# Patient Record
Sex: Female | Born: 1940 | Race: Black or African American | Hispanic: No | Smoking: Never smoker
Health system: Southern US, Community
[De-identification: ages and names within clinical notes are randomized; demographics above are authoritative.]

---

## 2008-06-30 ENCOUNTER — Encounter: Admission: RE | Admit: 2008-06-30 | Discharge: 2008-06-30 | Payer: Self-pay | Admitting: Sports Medicine

## 2008-08-25 ENCOUNTER — Encounter: Admission: RE | Admit: 2008-08-25 | Discharge: 2008-08-25 | Payer: Self-pay | Admitting: Sports Medicine

## 2008-09-02 ENCOUNTER — Ambulatory Visit: Payer: Self-pay | Admitting: Physical Medicine & Rehabilitation

## 2010-09-07 IMAGING — CR DG KNEE COMPLETE 4+V*L*
3 series · 3 of 3 positions shown · non-contrast
Comparison: None.

CLINICAL DATA: Bilateral, right greater than left, knee pain.
Bilateral arthroscopic surgery.

BILATERAL KNEES STANDING - 1 VIEW,LEFT KNEE - COMPLETE 4+
VIEW,RIGHT KNEE - COMPLETE 4+ VIEW

[view not recorded (1 of 3)]
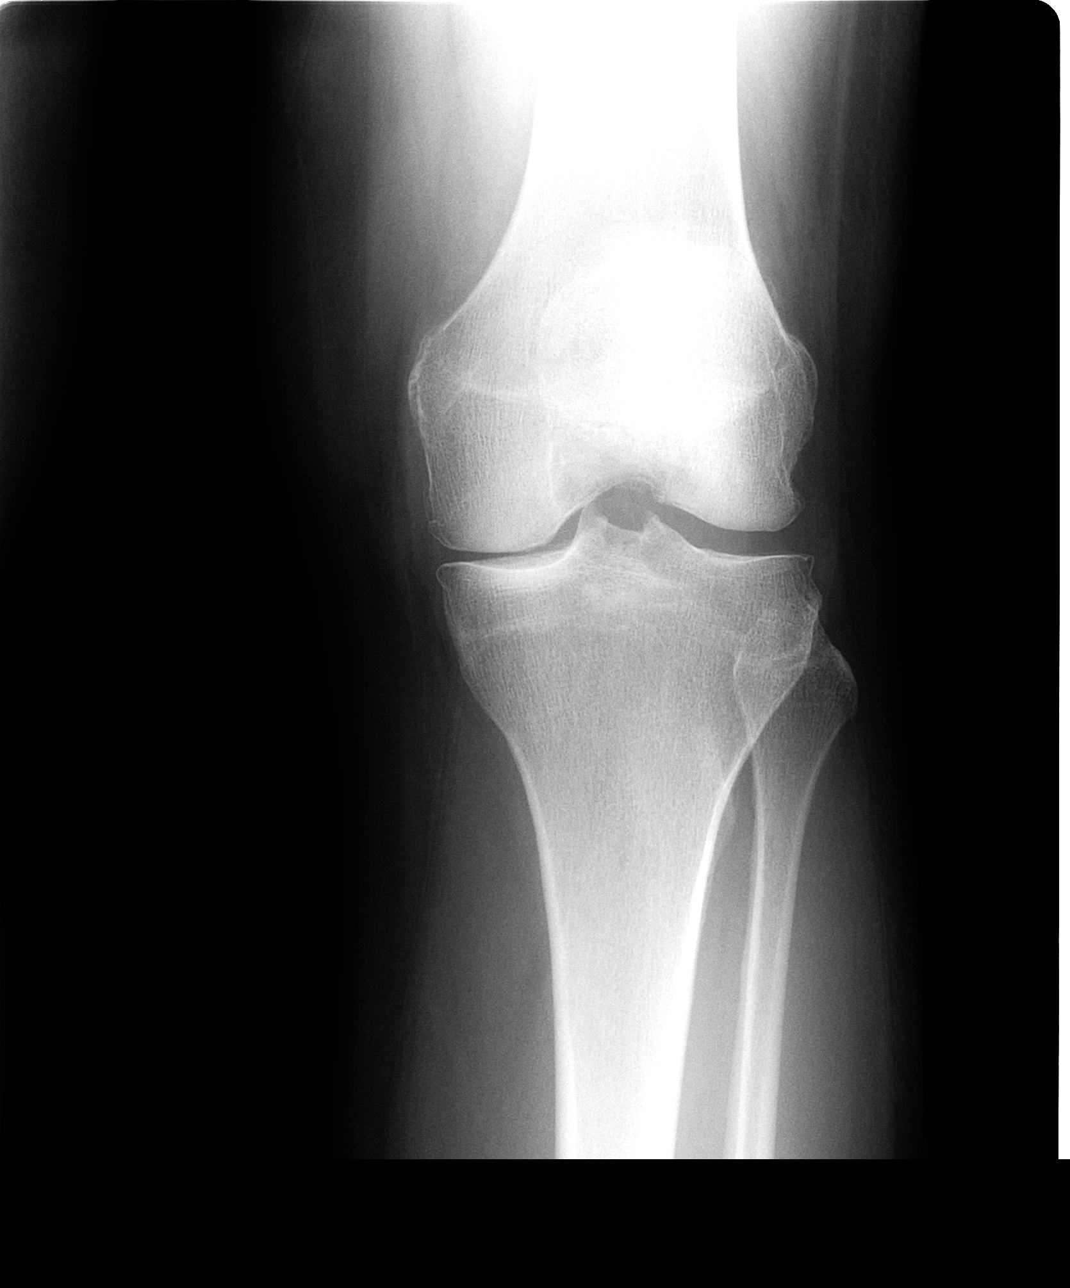

[view not recorded (2 of 3)]
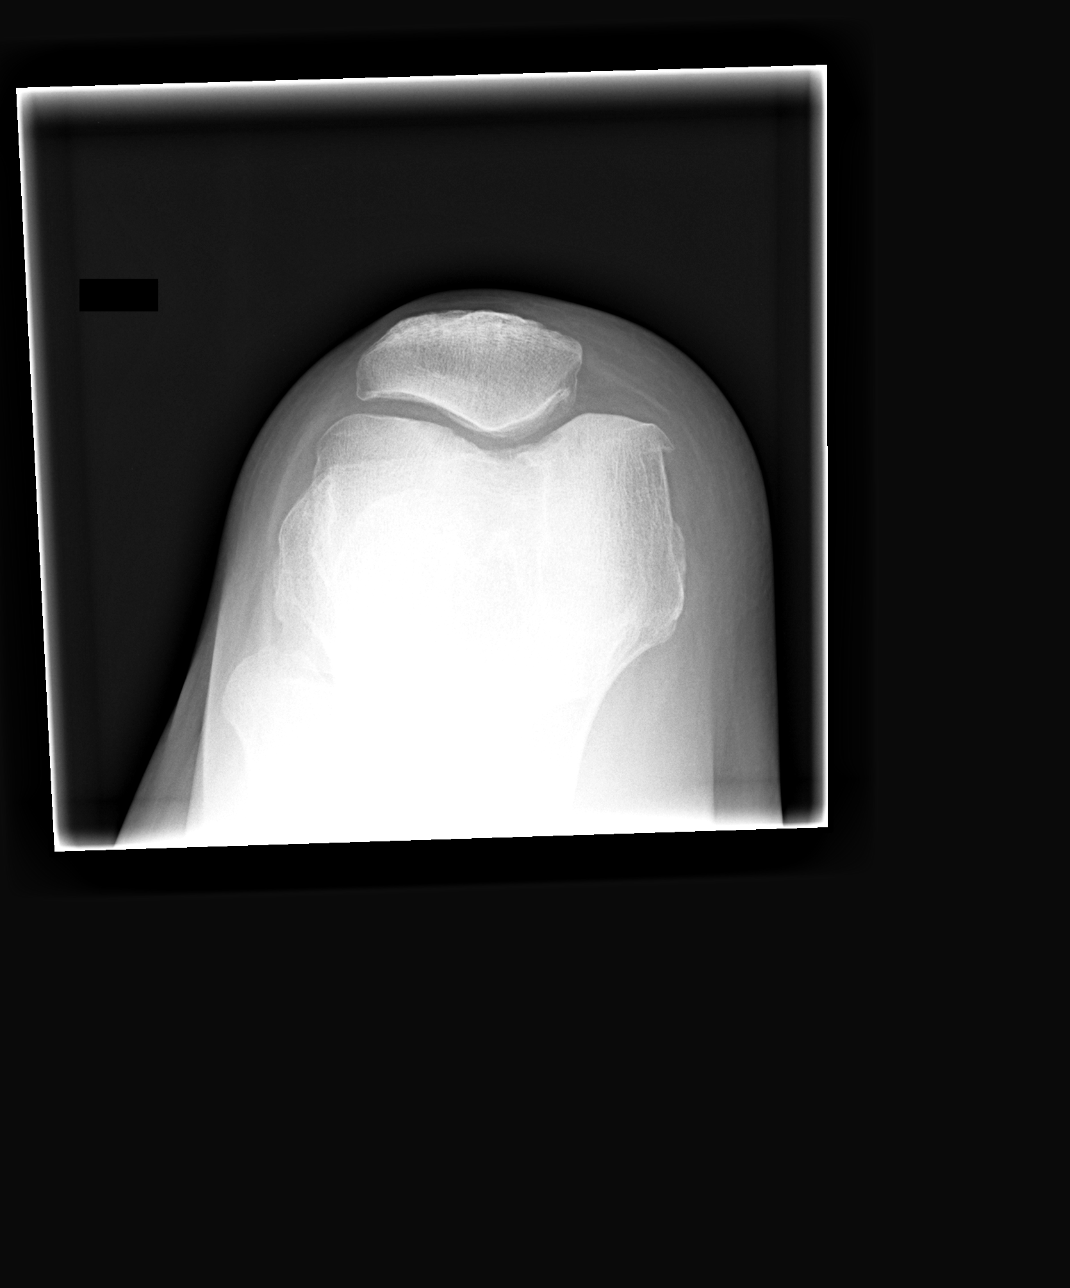

[view not recorded (3 of 3)]
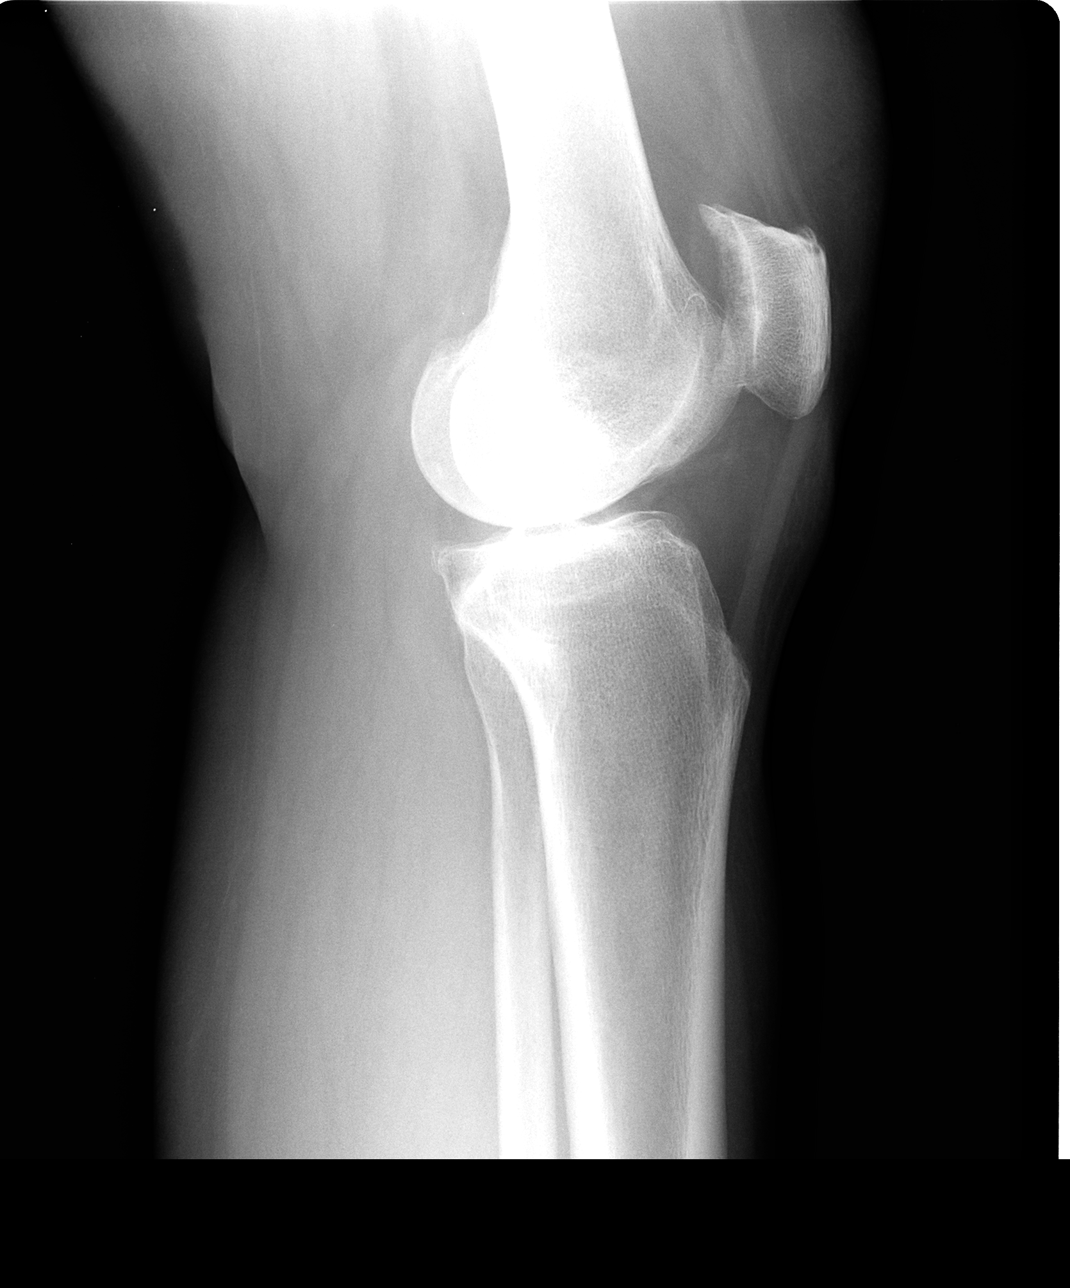

[3 of 3 positions shown; findings below may reference images not displayed]

FINDINGS: Mild tricompartment degenerative changes with joint space
narrowing most notable involving the medial tibial femoral joint
space and the patellofemoral joint space bilaterally.  Small right
suprapatellar joint effusion.
IMPRESSION: Mild tricompartment degenerative changes as noted above.

## 2010-09-07 IMAGING — CR DG KNEE STANDING AP BILAT
1 series · 1 of 1 positions shown · non-contrast
Comparison: None.

CLINICAL DATA: Bilateral, right greater than left, knee pain.
Bilateral arthroscopic surgery.

BILATERAL KNEES STANDING - 1 VIEW,LEFT KNEE - COMPLETE 4+
VIEW,RIGHT KNEE - COMPLETE 4+ VIEW

[view not recorded]
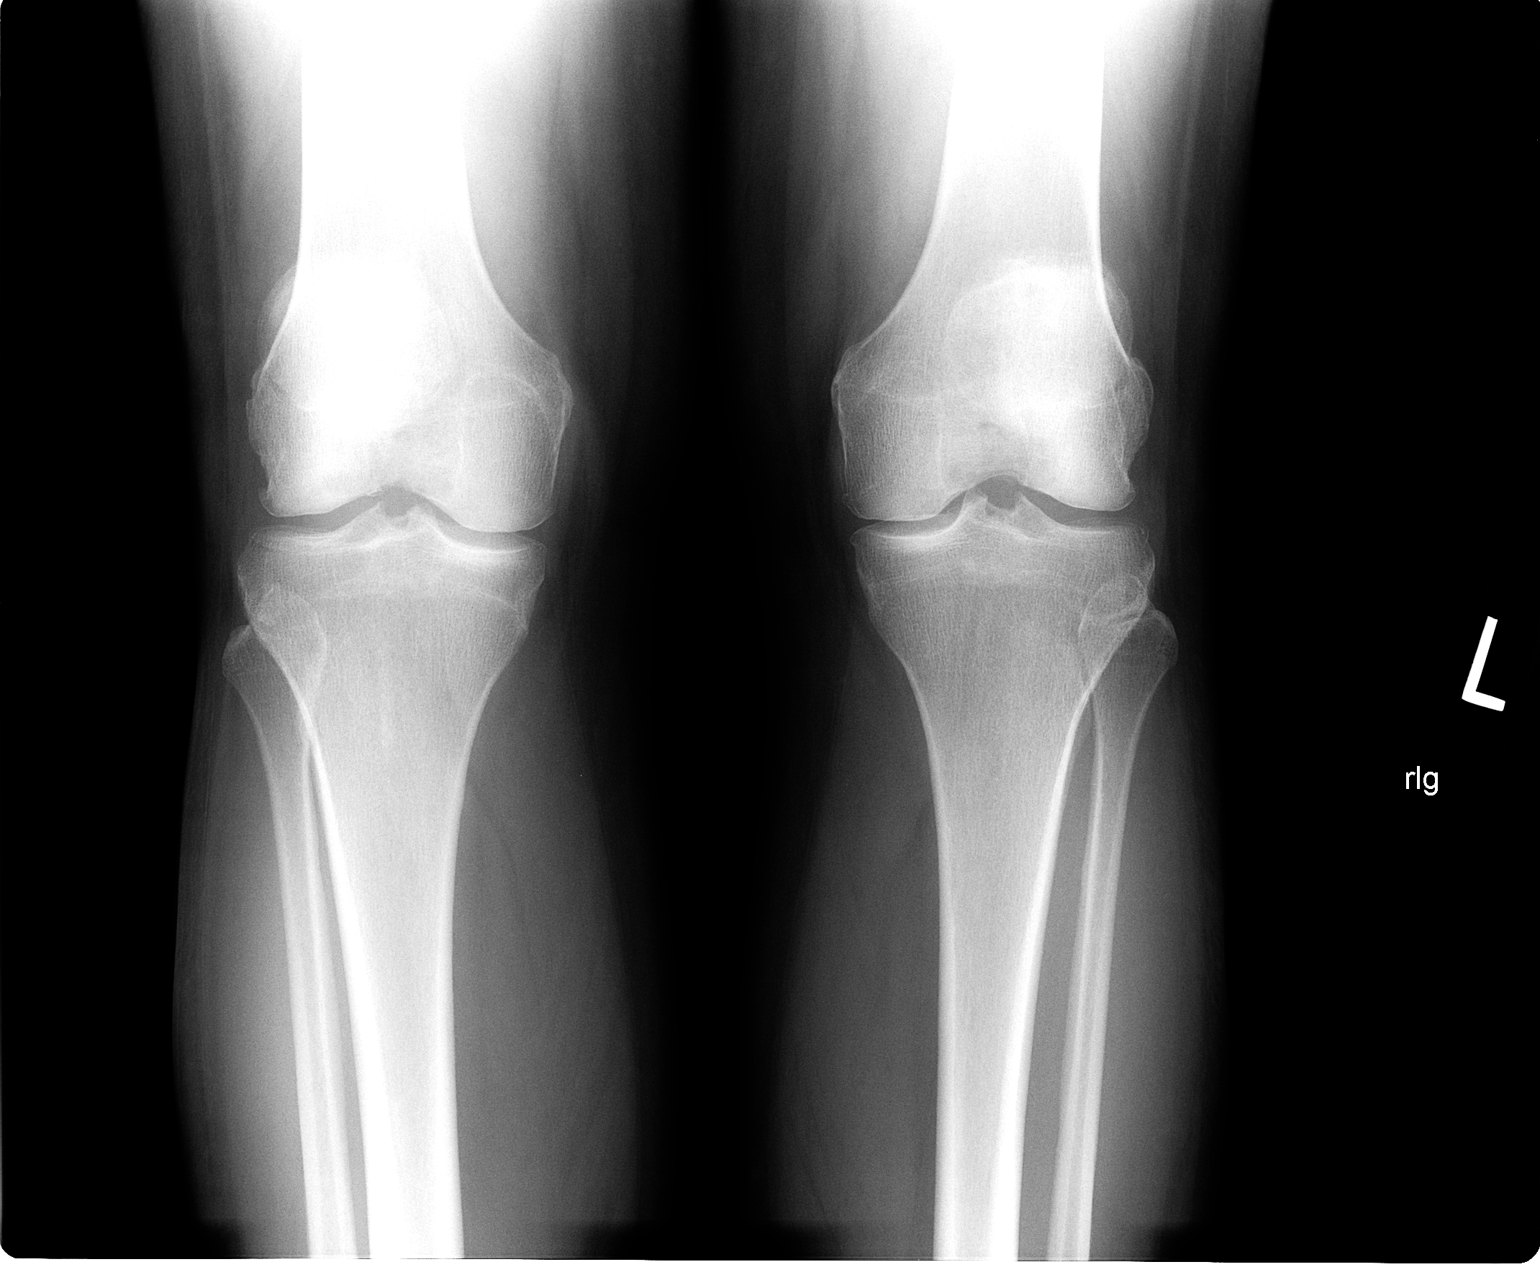

[1 of 1 positions shown; findings below may reference images not displayed]

FINDINGS: Mild tricompartment degenerative changes with joint space
narrowing most notable involving the medial tibial femoral joint
space and the patellofemoral joint space bilaterally.  Small right
suprapatellar joint effusion.
IMPRESSION: Mild tricompartment degenerative changes as noted above.

## 2013-11-01 ENCOUNTER — Ambulatory Visit (INDEPENDENT_AMBULATORY_CARE_PROVIDER_SITE_OTHER): Payer: 59 | Admitting: Physical Therapy

## 2013-11-01 DIAGNOSIS — M47817 Spondylosis without myelopathy or radiculopathy, lumbosacral region: Secondary | ICD-10-CM

## 2013-11-01 DIAGNOSIS — M76899 Other specified enthesopathies of unspecified lower limb, excluding foot: Secondary | ICD-10-CM

## 2013-11-01 DIAGNOSIS — M2550 Pain in unspecified joint: Secondary | ICD-10-CM

## 2013-11-01 DIAGNOSIS — M6281 Muscle weakness (generalized): Secondary | ICD-10-CM

## 2013-11-01 DIAGNOSIS — M67919 Unspecified disorder of synovium and tendon, unspecified shoulder: Secondary | ICD-10-CM

## 2013-11-01 DIAGNOSIS — M719 Bursopathy, unspecified: Secondary | ICD-10-CM

## 2013-11-01 DIAGNOSIS — M5030 Other cervical disc degeneration, unspecified cervical region: Secondary | ICD-10-CM

## 2013-11-09 ENCOUNTER — Encounter (INDEPENDENT_AMBULATORY_CARE_PROVIDER_SITE_OTHER): Payer: 59 | Admitting: Physical Therapy

## 2013-11-09 DIAGNOSIS — M47817 Spondylosis without myelopathy or radiculopathy, lumbosacral region: Secondary | ICD-10-CM

## 2013-11-09 DIAGNOSIS — M5030 Other cervical disc degeneration, unspecified cervical region: Secondary | ICD-10-CM

## 2013-11-09 DIAGNOSIS — M6281 Muscle weakness (generalized): Secondary | ICD-10-CM

## 2013-11-09 DIAGNOSIS — M719 Bursopathy, unspecified: Secondary | ICD-10-CM

## 2013-11-09 DIAGNOSIS — M76899 Other specified enthesopathies of unspecified lower limb, excluding foot: Secondary | ICD-10-CM

## 2013-11-09 DIAGNOSIS — M2550 Pain in unspecified joint: Secondary | ICD-10-CM

## 2013-11-09 DIAGNOSIS — M67919 Unspecified disorder of synovium and tendon, unspecified shoulder: Secondary | ICD-10-CM

## 2013-11-16 ENCOUNTER — Encounter (INDEPENDENT_AMBULATORY_CARE_PROVIDER_SITE_OTHER): Payer: 59 | Admitting: Physical Therapy

## 2013-11-16 DIAGNOSIS — M2550 Pain in unspecified joint: Secondary | ICD-10-CM

## 2013-11-16 DIAGNOSIS — M6281 Muscle weakness (generalized): Secondary | ICD-10-CM

## 2013-11-16 DIAGNOSIS — M47817 Spondylosis without myelopathy or radiculopathy, lumbosacral region: Secondary | ICD-10-CM

## 2013-11-16 DIAGNOSIS — M5030 Other cervical disc degeneration, unspecified cervical region: Secondary | ICD-10-CM

## 2013-11-16 DIAGNOSIS — M76899 Other specified enthesopathies of unspecified lower limb, excluding foot: Secondary | ICD-10-CM

## 2013-11-16 DIAGNOSIS — M7500 Adhesive capsulitis of unspecified shoulder: Secondary | ICD-10-CM

## 2014-01-24 ENCOUNTER — Ambulatory Visit: Payer: 59 | Admitting: Sports Medicine

## 2014-01-26 ENCOUNTER — Encounter: Payer: Self-pay | Admitting: Sports Medicine

## 2014-01-26 ENCOUNTER — Ambulatory Visit (INDEPENDENT_AMBULATORY_CARE_PROVIDER_SITE_OTHER): Payer: 59 | Admitting: Sports Medicine

## 2014-01-26 VITALS — BP 161/77 | HR 65 | Ht 65.0 in | Wt 186.0 lb

## 2014-01-26 DIAGNOSIS — M1710 Unilateral primary osteoarthritis, unspecified knee: Secondary | ICD-10-CM

## 2014-01-26 DIAGNOSIS — M170 Bilateral primary osteoarthritis of knee: Secondary | ICD-10-CM

## 2014-01-26 DIAGNOSIS — IMO0002 Reserved for concepts with insufficient information to code with codable children: Secondary | ICD-10-CM

## 2014-01-26 MED ORDER — MELOXICAM 15 MG PO TABS: ORAL_TABLET | ORAL | Status: DC

## 2014-01-26 NOTE — Progress Notes (Signed)
  Subjective:    CC: Bilateral knee pain  HPI:  This is a pleasant 73 year-old female, she has a history of multiple knee arthroscopies and knee osteoarthritis, she comes in with recurrence of pain she localizes up with joint lines. Moderate, persistent without mechanical symptoms. She has had physical supplementation in the past and desires to proceed only with steroid injection today. She is not using any oral medications.  Past medical history, Surgical history, Family history not pertinant except as noted below, Social history, Allergies, and medications have been entered into the medical record, reviewed, and no changes needed.   Review of Systems: No headache, visual changes, nausea, vomiting, diarrhea, constipation, dizziness, abdominal pain, skin rash, fevers, chills, night sweats, swollen lymph nodes, weight loss, chest pain, body aches, joint swelling, muscle aches, shortness of breath, mood changes, visual or auditory hallucinations.  Objective:    General: Well Developed, well nourished, and in no acute distress.  Neuro: Alert and oriented x3, extra-ocular muscles intact, sensation grossly intact.  HEENT: Normocephalic, atraumatic, pupils equal round reactive to light, neck supple, no masses, no lymphadenopathy, thyroid nonpalpable.  Skin: Warm and dry, no rashes noted.  Cardiac: Regular rate and rhythm, no murmurs rubs or gallops.  Respiratory: Clear to auscultation bilaterally. Not using accessory muscles, speaking in full sentences.  Abdominal: Soft, nontender, nondistended, positive bowel sounds, no masses, no organomegaly.  Bilateral Knee: No effusion, tender to palpation of the joint lines. ROM full in flexion and extension and lower leg rotation. Ligaments with solid consistent endpoints including ACL, PCL, LCL, MCL. Negative Mcmurray's, Apley's, and Thessalonian tests. Non painful patellar compression. Patellar glide without crepitus. Patellar and quadriceps tendons  unremarkable. Hamstring and quadriceps strength is normal.   Procedure: Real-time Ultrasound Guided Injection of right knee Device: GE Logiq E  Verbal informed consent obtained.  Time-out conducted.  Noted no overlying erythema, induration, or other signs of local infection.  Skin prepped in a sterile fashion.  Local anesthesia: Topical Ethyl chloride.  With sterile technique and under real time ultrasound guidance:  2 cc Kenalog 40, 4 cc lidocaine injected easily into the suprapatellar recess. Completed without difficulty  Pain immediately resolved suggesting accurate placement of the medication.  Advised to call if fevers/chills, erythema, induration, drainage, or persistent bleeding.  Images permanently stored and available for review in the ultrasound unit.  Impression: Technically successful ultrasound guided injection.  Procedure: Real-time Ultrasound Guided Injection of left knee Device: GE Logiq E  Verbal informed consent obtained.  Time-out conducted.  Noted no overlying erythema, induration, or other signs of local infection.  Skin prepped in a sterile fashion.  Local anesthesia: Topical Ethyl chloride.  With sterile technique and under real time ultrasound guidance:  2 cc Kenalog 40, 4 cc lidocaine injected easily into the suprapatellar recess. Completed without difficulty  Pain immediately resolved suggesting accurate placement of the medication.  Advised to call if fevers/chills, erythema, induration, drainage, or persistent bleeding.  Images permanently stored and available for review in the ultrasound unit.  Impression: Technically successful ultrasound guided injection.  Impression and Recommendations:    The patient was counselled, risk factors were discussed, anticipatory guidance given.

## 2014-01-26 NOTE — Assessment & Plan Note (Signed)
Injection into both knees. Mobic, physical therapy. Return in a month.

## 2014-02-09 ENCOUNTER — Ambulatory Visit (INDEPENDENT_AMBULATORY_CARE_PROVIDER_SITE_OTHER): Payer: 59 | Admitting: Physical Therapy

## 2014-02-09 ENCOUNTER — Ambulatory Visit: Payer: 59 | Admitting: Sports Medicine

## 2014-02-09 DIAGNOSIS — M25669 Stiffness of unspecified knee, not elsewhere classified: Secondary | ICD-10-CM

## 2014-02-09 DIAGNOSIS — M1710 Unilateral primary osteoarthritis, unspecified knee: Secondary | ICD-10-CM

## 2014-02-09 DIAGNOSIS — IMO0002 Reserved for concepts with insufficient information to code with codable children: Secondary | ICD-10-CM

## 2014-02-09 DIAGNOSIS — M25569 Pain in unspecified knee: Secondary | ICD-10-CM

## 2014-02-09 DIAGNOSIS — R269 Unspecified abnormalities of gait and mobility: Secondary | ICD-10-CM

## 2014-02-17 ENCOUNTER — Encounter (INDEPENDENT_AMBULATORY_CARE_PROVIDER_SITE_OTHER): Payer: 59 | Admitting: Physical Therapy

## 2014-02-17 DIAGNOSIS — IMO0002 Reserved for concepts with insufficient information to code with codable children: Secondary | ICD-10-CM

## 2014-02-17 DIAGNOSIS — R269 Unspecified abnormalities of gait and mobility: Secondary | ICD-10-CM

## 2014-02-17 DIAGNOSIS — M25669 Stiffness of unspecified knee, not elsewhere classified: Secondary | ICD-10-CM

## 2014-02-17 DIAGNOSIS — M1710 Unilateral primary osteoarthritis, unspecified knee: Secondary | ICD-10-CM

## 2014-02-17 DIAGNOSIS — M25569 Pain in unspecified knee: Secondary | ICD-10-CM

## 2014-02-23 ENCOUNTER — Encounter (INDEPENDENT_AMBULATORY_CARE_PROVIDER_SITE_OTHER): Payer: 59 | Admitting: Physical Therapy

## 2014-02-23 ENCOUNTER — Ambulatory Visit (INDEPENDENT_AMBULATORY_CARE_PROVIDER_SITE_OTHER): Payer: 59 | Admitting: Sports Medicine

## 2014-02-23 ENCOUNTER — Encounter: Payer: Self-pay | Admitting: Sports Medicine

## 2014-02-23 VITALS — BP 171/77 | HR 59 | Ht 65.0 in | Wt 189.0 lb

## 2014-02-23 DIAGNOSIS — M1710 Unilateral primary osteoarthritis, unspecified knee: Secondary | ICD-10-CM

## 2014-02-23 DIAGNOSIS — M170 Bilateral primary osteoarthritis of knee: Secondary | ICD-10-CM

## 2014-02-23 DIAGNOSIS — M25569 Pain in unspecified knee: Secondary | ICD-10-CM

## 2014-02-23 DIAGNOSIS — IMO0002 Reserved for concepts with insufficient information to code with codable children: Secondary | ICD-10-CM

## 2014-02-23 DIAGNOSIS — M25669 Stiffness of unspecified knee, not elsewhere classified: Secondary | ICD-10-CM

## 2014-02-23 DIAGNOSIS — R269 Unspecified abnormalities of gait and mobility: Secondary | ICD-10-CM

## 2014-02-23 MED ORDER — DEVILS CLAW ROOT POWD: Status: DC

## 2014-02-23 MED ORDER — GLUCOSAMINE-CHONDROITIN 750-600 MG PO CHEW: 2.0000 | CHEWABLE_TABLET | Freq: Two times a day (BID) | ORAL | Status: AC

## 2014-02-23 NOTE — Assessment & Plan Note (Signed)
Doing well after injection, still has some pain. She did have some hematochezia after using meloxicam, and has been set up with gastroenterology by her primary care doctor. She does desire to proceed more with nonpharmacologic measures, I have recommended glucosamine/chondroitin and Devils Claw. Return as needed, she will be a candidate for viscus supplementation should she desire.

## 2014-02-23 NOTE — Progress Notes (Signed)
  Subjective:    CC: Followup  HPI: Bilateral knee osteoarthritis: This is a very pleasant 73 year old female, one month ago we did bilateral intra-articular injections, formal physical therapy, and meloxicam. She had a fantastic response to injections but unfortunately had some hematochezia on the meloxicam and has been referred to gastroenterology by her primary care doctor. Overall happy with the results, she does desire to proceed further with a more natural, I have recommended glucosamine/chondroitin and devils claw. Pain is mild, improving.  Past medical history, Surgical history, Family history not pertinant except as noted below, Social history, Allergies, and medications have been entered into the medical record, reviewed, and no changes needed.   Review of Systems: No fevers, chills, night sweats, weight loss, chest pain, or shortness of breath.   Objective:    General: Well Developed, well nourished, and in no acute distress.  Neuro: Alert and oriented x3, extra-ocular muscles intact, sensation grossly intact.  HEENT: Normocephalic, atraumatic, pupils equal round reactive to light, neck supple, no masses, no lymphadenopathy, thyroid nonpalpable.  Skin: Warm and dry, no rashes. Cardiac: Regular rate and rhythm, no murmurs rubs or gallops, no lower extremity edema.  Respiratory: Clear to auscultation bilaterally. Not using accessory muscles, speaking in full sentences. Bilateral Knee: Normal to inspection with no erythema or effusion or obvious bony abnormalities. Palpation normal with no warmth or joint line tenderness or patellar tenderness or condyle tenderness. ROM normal in flexion and extension and lower leg rotation. Ligaments with solid consistent endpoints including ACL, PCL, LCL, MCL. Negative Mcmurray's and provocative meniscal tests. Non painful patellar compression. Patellar and quadriceps tendons unremarkable. Hamstring and quadriceps strength is  normal.  Impression and Recommendations:

## 2014-06-20 ENCOUNTER — Other Ambulatory Visit: Payer: Self-pay | Admitting: Sports Medicine

## 2017-07-08 ENCOUNTER — Ambulatory Visit: Payer: Medicare Other | Admitting: Podiatry

## 2017-07-08 ENCOUNTER — Ambulatory Visit (INDEPENDENT_AMBULATORY_CARE_PROVIDER_SITE_OTHER): Payer: Medicare Other

## 2017-07-08 ENCOUNTER — Encounter: Payer: Self-pay | Admitting: Podiatry

## 2017-07-08 VITALS — BP 142/68 | HR 59

## 2017-07-08 DIAGNOSIS — E1142 Type 2 diabetes mellitus with diabetic polyneuropathy: Secondary | ICD-10-CM

## 2017-07-08 DIAGNOSIS — R52 Pain, unspecified: Secondary | ICD-10-CM

## 2017-07-08 DIAGNOSIS — M79605 Pain in left leg: Secondary | ICD-10-CM | POA: Diagnosis not present

## 2017-07-08 DIAGNOSIS — M79604 Pain in right leg: Secondary | ICD-10-CM | POA: Diagnosis not present

## 2017-07-08 NOTE — Patient Instructions (Signed)
Diabetic foot screen today demonstrated adequate circulation with adequate feeling Your main problem is painful diabetic peripheral neuropathy Currently you're taking gabapentin 300 mg by mouth 1 -    3 times a day for the past year At bedtime only increase the gabapentin from 1 to 2 -300 mg gabapentin Take your tramadol approximately an hour prior to going to bed You have an appointment pending with neurologist in the next month, consult a neurologist about other treatment options for painful diabetic peripheral neuropathy   Diabetes and Foot Care Diabetes may cause you to have problems because of poor blood supply (circulation) to your feet and legs. This may cause the skin on your feet to become thinner, break easier, and heal more slowly. Your skin may become dry, and the skin may peel and crack. You may also have nerve damage in your legs and feet causing decreased feeling in them. You may not notice minor injuries to your feet that could lead to infections or more serious problems. Taking care of your feet is one of the most important things you can do for yourself. Follow these instructions at home:  Wear shoes at all times, even in the house. Do not go barefoot. Bare feet are easily injured.  Check your feet daily for blisters, cuts, and redness. If you cannot see the bottom of your feet, use a mirror or ask someone for help.  Wash your feet with warm water (do not use hot water) and mild soap. Then pat your feet and the areas between your toes until they are completely dry. Do not soak your feet as this can dry your skin.  Apply a moisturizing lotion or petroleum jelly (that does not contain alcohol and is unscented) to the skin on your feet and to dry, brittle toenails. Do not apply lotion between your toes.  Trim your toenails straight across. Do not dig under them or around the cuticle. File the edges of your nails with an emery board or nail file.  Do not cut corns or calluses or  try to remove them with medicine.  Wear clean socks or stockings every day. Make sure they are not too tight. Do not wear knee-high stockings since they may decrease blood flow to your legs.  Wear shoes that fit properly and have enough cushioning. To break in new shoes, wear them for just a few hours a day. This prevents you from injuring your feet. Always look in your shoes before you put them on to be sure there are no objects inside.  Do not cross your legs. This may decrease the blood flow to your feet.  If you find a minor scrape, cut, or break in the skin on your feet, keep it and the skin around it clean and dry. These areas may be cleansed with mild soap and water. Do not cleanse the area with peroxide, alcohol, or iodine.  When you remove an adhesive bandage, be sure not to damage the skin around it.  If you have a wound, look at it several times a day to make sure it is healing.  Do not use heating pads or hot water bottles. They may burn your skin. If you have lost feeling in your feet or legs, you may not know it is happening until it is too late.  Make sure your health care provider performs a complete foot exam at least annually or more often if you have foot problems. Report any cuts, sores, or bruises to  your health care provider immediately. Contact a health care provider if:  You have an injury that is not healing.  You have cuts or breaks in the skin.  You have an ingrown nail.  You notice redness on your legs or feet.  You feel burning or tingling in your legs or feet.  You have pain or cramps in your legs and feet.  Your legs or feet are numb.  Your feet always feel cold. Get help right away if:  There is increasing redness, swelling, or pain in or around a wound.  There is a red line that goes up your leg.  Pus is coming from a wound.  You develop a fever or as directed by your health care provider.  You notice a bad smell coming from an ulcer or  wound. This information is not intended to replace advice given to you by your health care provider. Make sure you discuss any questions you have with your health care provider. Document Released: 07/26/2000 Document Revised: 01/04/2016 Document Reviewed: 01/05/2013 Elsevier Interactive Patient Education  2017 [employer].

## 2017-07-08 NOTE — Progress Notes (Signed)
Subjective:    Patient ID: Shelley Barker, female    DOB: 05/09/1941, 76 y.o.   MRN: [id number]  HPI This patient presents today complaining of a 1+ year history of extremely painful, burning, numbness, stinging in her right and left feet on and off weightbearing. As a result of these painful sensations patient has extremely difficult time falling and staying asleep. Patient said she has had Lyrica in the past was intolerant of the medication was discontinued. Patient currently taking gabapentin 300 mg 3 times a day for approximately 1 year with little to no reduction the symptoms. Patient denies any side effects from the gabapentin. She also mentions that she takes tramadol for generalized arthritic pain. Also, patient has tried a variety of creams without any improvement of symptoms Patient secondarily is concerned about some prominence the base of first metatarsal cuneiform areas as well as the posterior left heel has occasional discomfort Patient says she is a diabetic for approximately 2 years She denies any history of claudication or amputation She denies smoking history   Review of Systems  HENT: Positive for rhinorrhea and trouble swallowing.   Eyes: Positive for redness, itching and visual disturbance.  Respiratory: Positive for shortness of breath.   Cardiovascular: Positive for chest pain.  Gastrointestinal: Positive for abdominal pain.  Endocrine: Positive for heat intolerance.  Musculoskeletal: Positive for back pain and gait problem.       Muscle pain  Skin: Positive for color change.  Neurological: Positive for light-headedness and numbness.       Objective:   Physical Exam   Patient estimates height is 5 feet 5 inches and weighed approximately 190 pounds Orientated 3  Vascular: No peripheral edema noted bilaterally DP pulses 2/4 bilaterally PT pulses 2/4 bilaterally Reflex within normal limits bilaterally  Neurologic: Sensation to 10 g monofilament wire intact  8/8 bilaterally Vibratory sensation reactive bilaterally Ankle reflexes reactive bilaterally  Dermatological: No open skin lesions bilaterally Absent hair growth bilaterally  Musculoskeletal: Mild prominence dorsal bases first metatarsocuneiform area bilaterally Hammertoe second bilaterally No restriction ankle, subtalar, midtarsal joints bilaterally  X-ray examination weightbearing right foot dated 07/08/2017 Intact bony structures without fracture and/or dislocation Inferior calcaneal spur Hammertoes 2, 3 HAV General bone density adequate Mild decrease joint space first MPJ  Radiographic impression: No acute bony abnormality noted weightbearing x-ray of 07/08/2017  X-ray examination weightbearing left foot dated 07/08/2017 Intact bony structure without fracture and/or dislocation  bone density within normal limits Posterior heel spur with some calcification in the tendo Achilles at its distal insertion Hammertoe 2, 3  Radiographic impression: No acute bony abnormality noted weightbearing x-ray of left foot dated 07/08/2017       Assessment & Plan:   Assessment: Diabetic with satisfactory vascular status Diabetic with protective sensation intact Painful diabetic peripheral neuropathy  Plan: Today I reviewed the results of the exam an x-ray with patient today. Her primary concern is the painful neuropathy that has not responded to gabapentin 300 mg 3 times a day. I am recommending that patient increase the gabapentin by taking 2 - 300 mg gabapentin at bedtime and maintaining your other doses as previous prescribed In addition I suggested that patient take her tramadol approximate one hour prior to bedtime I made patient aware that treating diabetic peripheral neuropathy often times is difficult and very empirical. I suggested patient may want to consult a neurologist or pain management specialist. Patient states that she has a pending neurological evaluation in the next  month. I recommended  patient that she discuss the painful peripheral neuropathy with a neurologist. At that visit with the neurologist patient would have approximately a month experience with increasing the gabapentin.  Reappoint at patient's request

## 2017-07-08 NOTE — Addendum Note (Signed)
Addended byMaury Dus: Dantrell Schertzer L on: 07/08/2017 02:30 PM   Modules accepted: Orders

## 2017-08-13 ENCOUNTER — Other Ambulatory Visit: Payer: Self-pay | Admitting: Podiatry

## 2017-08-13 DIAGNOSIS — E1142 Type 2 diabetes mellitus with diabetic polyneuropathy: Secondary | ICD-10-CM
